# Patient Record
Sex: Male | Born: 1985 | Hispanic: Yes | Marital: Single | Smoking: Never smoker
Health system: Southern US, Community
[De-identification: ages and names within clinical notes are randomized; demographics above are authoritative.]

---

## 2014-02-15 ENCOUNTER — Ambulatory Visit: Payer: Self-pay | Admitting: Unknown Physician Specialty

## 2014-02-24 ENCOUNTER — Ambulatory Visit: Payer: Self-pay | Admitting: Unknown Physician Specialty

## 2014-03-09 ENCOUNTER — Ambulatory Visit: Payer: Self-pay | Admitting: Gastroenterology

## 2014-03-13 LAB — PATHOLOGY REPORT

## 2015-03-30 IMAGING — CT CT ABD-PELV W/ CM
2 of 4 series · 16 of 46 positions shown, 18 images · IV contrast (isovue)
Comparison: None.

CLINICAL DATA: Pain.

EXAM:
CT ABDOMEN AND PELVIS WITH CONTRAST
TECHNIQUE: Multidetector CT imaging of the abdomen and pelvis was performed
using the standard protocol following bolus administration of
intravenous contrast.
CONTRAST:  100 cc Isovue 370.

[Series 2: routine abd pel with · axial · 0.78mm/px · z∈[-884,-414]mm · 13 of 104 slices shown, 15 images]
[im 5/104  soft-tissue]
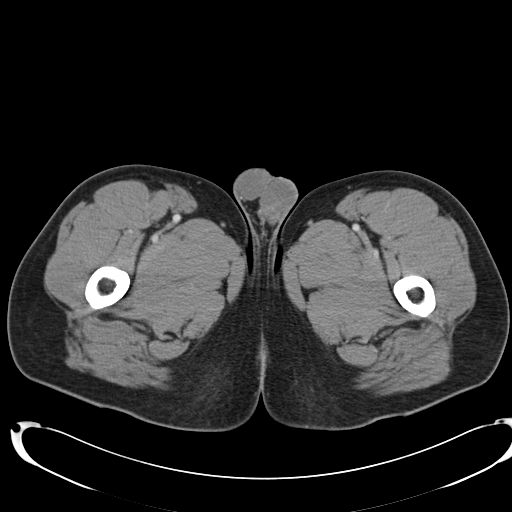
[im 5/104  bone]
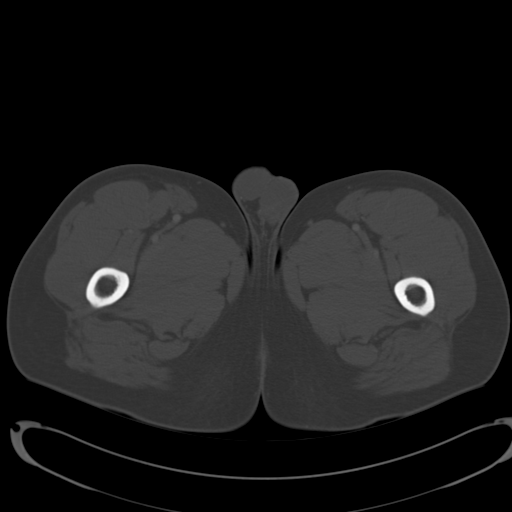
[im 13/104  soft-tissue]
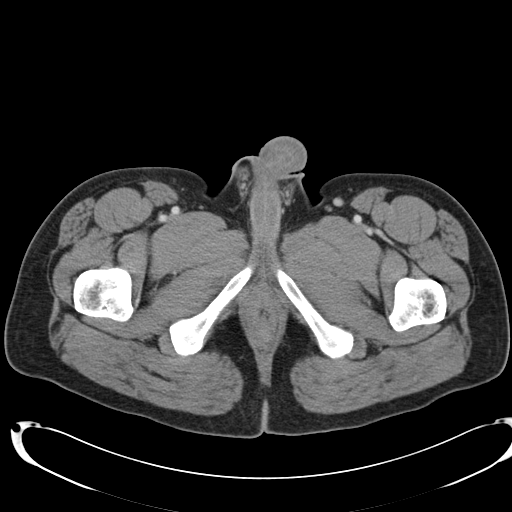
[im 21/104  soft-tissue]
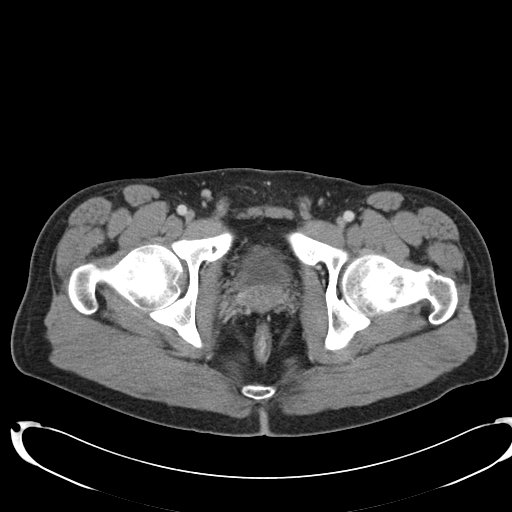
[im 29/104  soft-tissue]
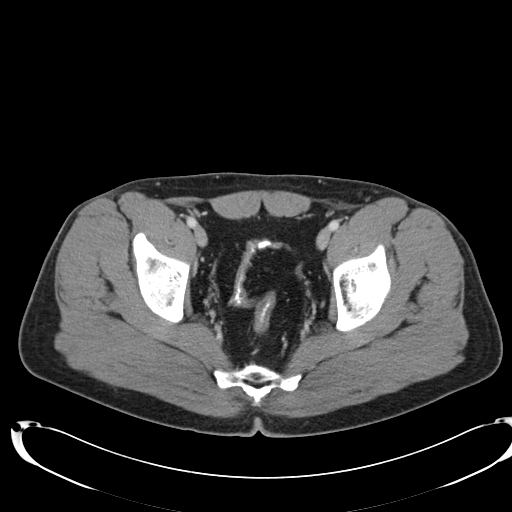
[im 38/104  soft-tissue]
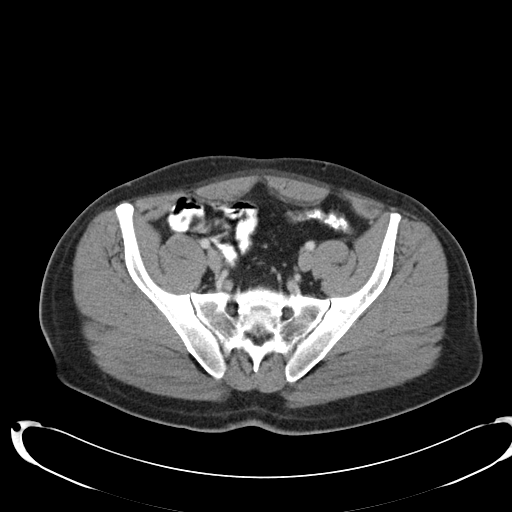
[im 46/104  soft-tissue]
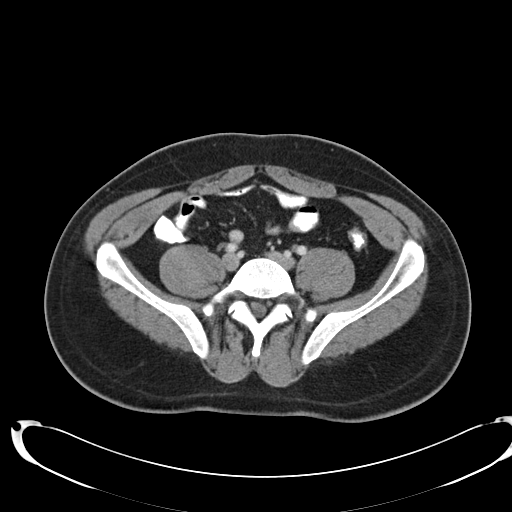
[im 54/104  soft-tissue]
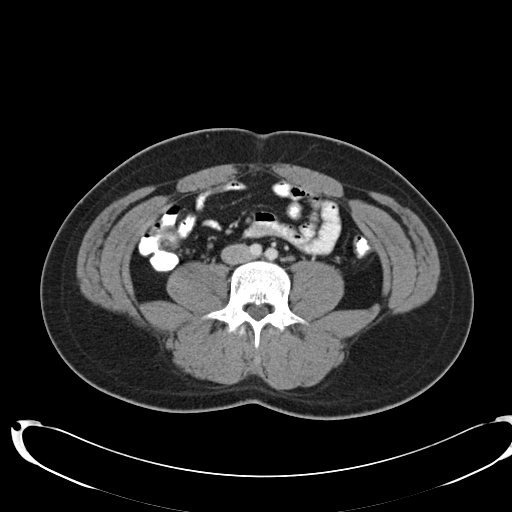
[im 58/104  soft-tissue]
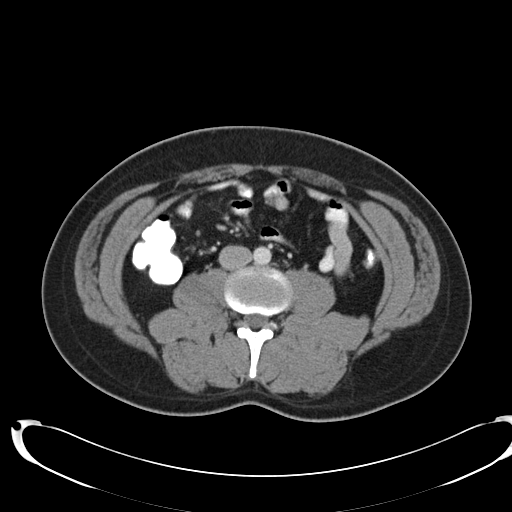
[im 66/104  soft-tissue]
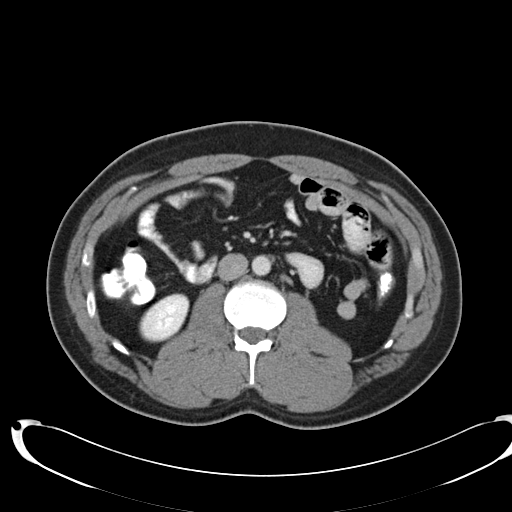
[im 66/104  bone]
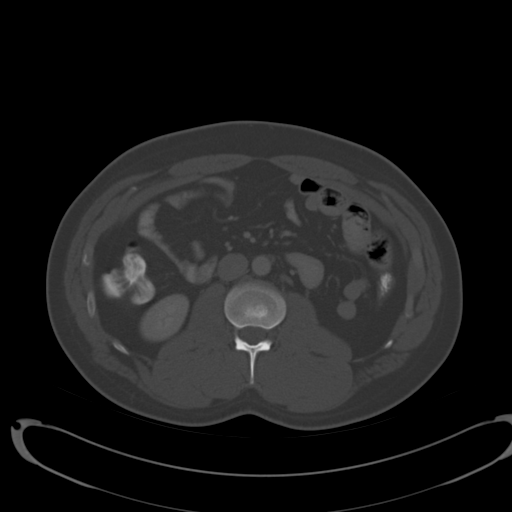
[im 75/104  soft-tissue]
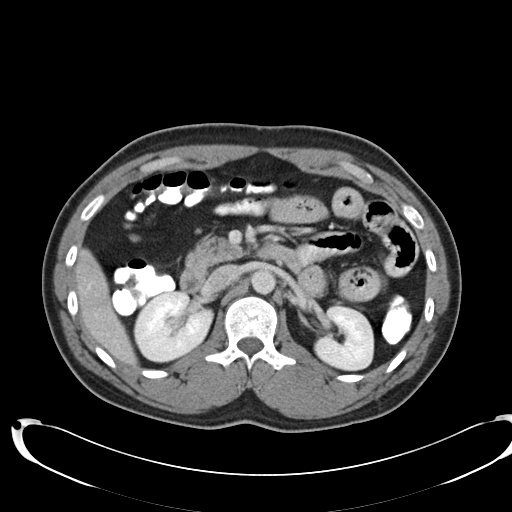
[im 83/104  soft-tissue]
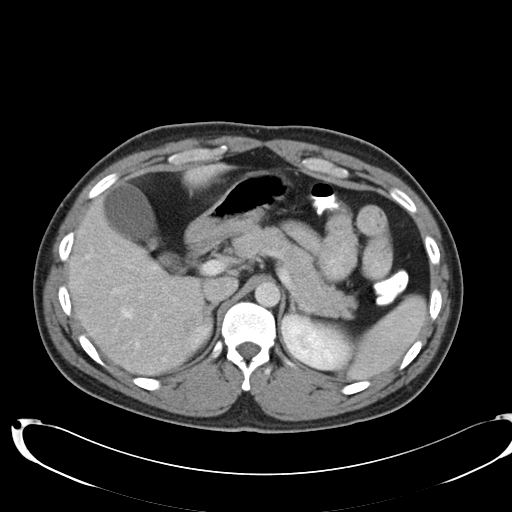
[im 91/104  soft-tissue]
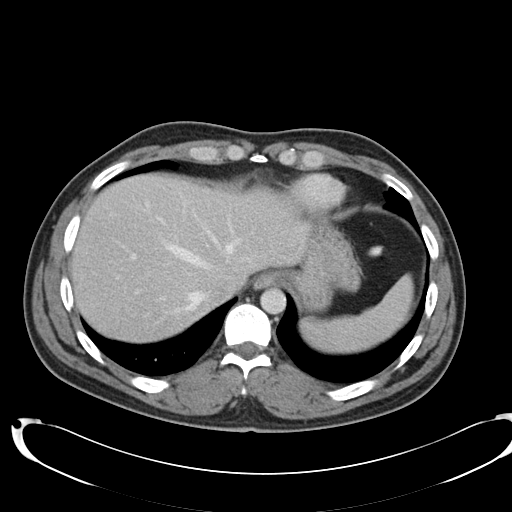
[im 99/104  soft-tissue]
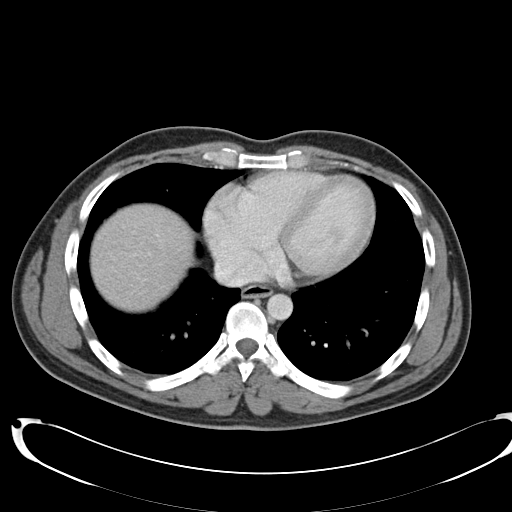

[Series 5: cor routine abd pel with · coronal · 0.69mm/px · 3 of 133 slices shown]
[im 45/133  soft-tissue]
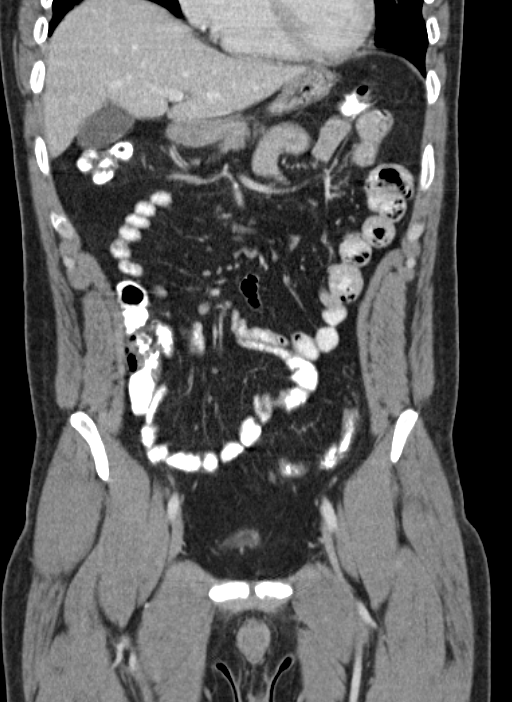
[im 59/133  soft-tissue]
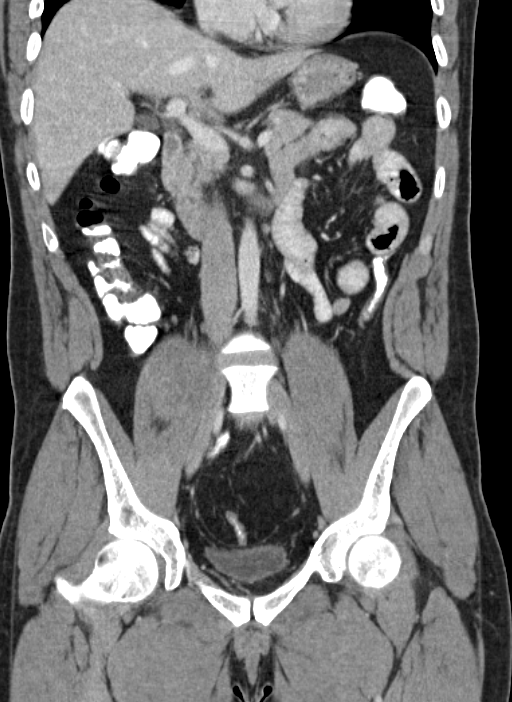
[im 74/133  soft-tissue]
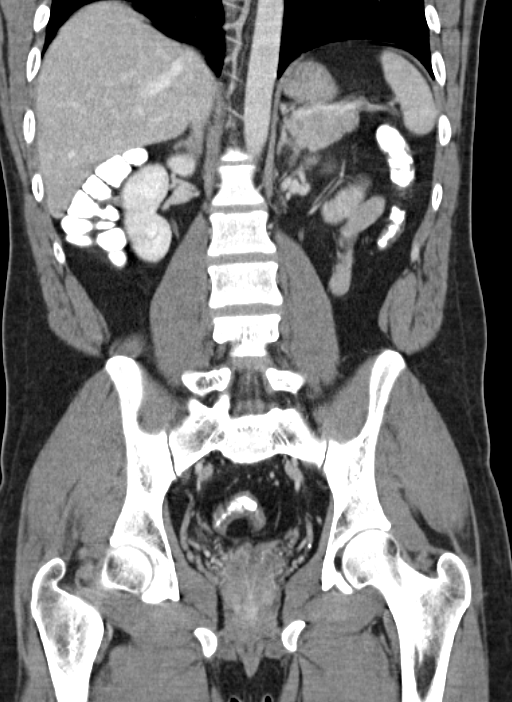

[16 of 46 positions shown; findings below may reference images not displayed]

FINDINGS: Fatty infiltration liver cannot be excluded. No focal hepatic
abnormality. Spleen normal. Pancreas normal. No biliary distention.
Gallbladder nondistended.

Adrenals normal. Kidneys normal. No hydronephrosis or obstructing
ureteral stone. Bladder is nondistended.

Shotty inguinal and retroperitoneal lymph nodes. Aorta normal
caliber widely patent. Visceral vessels widely patent. Portal vein
is patent.

Appendix normal. Mild thickening noted of the rectosigmoid and left
colon. This may be from the collapsed state of the bowel however
colitis cannot be excluded. There is no evidence of bowel
distention. There is no free air. No mesenteric mass. Tiny umbilical
hernia with herniation of fat only. No bowel herniation.

Lung bases clear. Heart size normal. No acute bony abnormality. Tiny
sclerotic densities are noted over the right ischium and right
acetabulum. Although these are most likely bone islands, blastic
metastatic disease cannot be excluded and whole body bone scan can
be obtained for further evaluation.
IMPRESSION: 1. Mild thickening noted diffusely of the rectosigmoid and left
colon. This may be from collapsed state of the bowel, however
colitis cannot be excluded.
2. Fatty infiltration of liver cannot be excluded.
3. Tiny sclerotic densities noted over the right ischium and right
acetabulum. Although these most likely represent bone islands,
blastic metastatic disease cannot be excluded and whole body bone
scan can be obtained for further evaluation.

## 2021-07-01 ENCOUNTER — Encounter: Payer: Self-pay | Admitting: Emergency Medicine

## 2021-07-01 ENCOUNTER — Ambulatory Visit
Admission: EM | Admit: 2021-07-01 | Discharge: 2021-07-01 | Disposition: A | Discharge status: Home / Self Care | Payer: Self-pay | Attending: Internal Medicine | Admitting: Internal Medicine

## 2021-07-01 ENCOUNTER — Other Ambulatory Visit: Payer: Self-pay

## 2021-07-01 DIAGNOSIS — J111 Influenza due to unidentified influenza virus with other respiratory manifestations: Secondary | ICD-10-CM

## 2021-07-01 MED ORDER — BENZONATATE 200 MG PO CAPS: 200.0000 mg | ORAL_CAPSULE | Freq: Three times a day (TID) | ORAL | 0 refills | Status: AC | PRN

## 2021-07-01 MED ORDER — OSELTAMIVIR PHOSPHATE 75 MG PO CAPS: 75.0000 mg | ORAL_CAPSULE | Freq: Two times a day (BID) | ORAL | 0 refills | Status: AC

## 2021-07-01 NOTE — ED Provider Notes (Signed)
[facility]    CSN: [id number] Arrival date & time: 07/01/21  0845      History   Chief Complaint Chief Complaint  Patient presents with   Cough    HPI Caleb Noble is a 35 y.o. male who presents with subjective fever, body aches, cough, nose congestion, rhinitis, ST and bilateral ear pain x 3 days. He was exposed to Flu in his home. He developed a subjective high  fever  and aches 2 nights ago. He felt worse yesterday. His daughter had Flu A.     History reviewed. No pertinent past medical history.  There are no problems to display for this patient.   History reviewed. No pertinent surgical history.     Home Medications    Prior to Admission medications   Medication Sig Start Date End Date Taking? Authorizing Provider  benzonatate (TESSALON) 200 MG capsule Take 1 capsule (200 mg total) by mouth 3 (three) times daily as needed. 07/01/21  Yes Rodriguez-Southworth, Nettie Elm, PA-C  oseltamivir (TAMIFLU) 75 MG capsule Take 1 capsule (75 mg total) by mouth every 12 (twelve) hours. 07/01/21  Yes Rodriguez-Southworth, Nettie Elm, PA-C    Family History No family history on file.  Social History Social History   Tobacco Use   Smoking status: Never   Smokeless tobacco: Never  Vaping Use   Vaping Use: Never used  Substance Use Topics   Alcohol use: Not Currently   Drug use: Not Currently     Allergies   Patient has no known allergies.   Review of Systems Review of Systems  Constitutional:  Positive for activity change, appetite change, chills, fatigue and fever.  HENT:  Positive for congestion, ear pain, postnasal drip and rhinorrhea. Negative for ear discharge, sore throat and trouble swallowing.        His lower throat hurts to cough  Eyes:  Negative for discharge.  Respiratory:  Positive for cough. Negative for chest tightness.   Cardiovascular:  Negative for chest pain.  Gastrointestinal:  Negative for diarrhea, nausea and vomiting.   Musculoskeletal:  Positive for myalgias. Negative for gait problem, neck pain and neck stiffness.  Skin:  Negative for rash.  Neurological:  Positive for headaches.  Hematological:  Positive for adenopathy.    Physical Exam Triage Vital Signs ED Triage Vitals  Enc Vitals Group     BP 07/01/21 0916 (!) 141/92     Pulse Rate 07/01/21 0916 (!) 105     Resp 07/01/21 0916 18     Temp 07/01/21 0916 99.3 F (37.4 C)     Temp Source 07/01/21 0916 Oral     SpO2 07/01/21 0916 100 %     Weight 07/01/21 0913 180 lb (81.6 kg)     Height 07/01/21 0913 5\' 7"  (1.702 m)     Head Circumference --      Peak Flow --      Pain Score 07/01/21 0913 8     Pain Loc --      Pain Edu? --      Excl. in GC? --    No data found.  Updated Vital Signs BP (!) 141/92 (BP Location: Left Arm)   Pulse (!) 105   Temp 99.3 F (37.4 C) (Oral)   Resp 18   Ht 5\' 7"  (1.702 m)   Wt 180 lb (81.6 kg)   SpO2 100%   BMI 28.19 kg/m   Visual Acuity Right Eye Distance:   Left Eye Distance:   Bilateral Distance:  Right Eye Near:   Left Eye Near:    Bilateral Near:     Physical Exam Constitutional:      General: He is not in acute distress.    Appearance: He is normal weight. He is ill-appearing. He is not toxic-appearing.  HENT:     Right Ear: Tympanic membrane, ear canal and external ear normal.     Left Ear: Tympanic membrane, ear canal and external ear normal.     Nose: Rhinorrhea present.     Mouth/Throat:     Mouth: Mucous membranes are moist.     Pharynx: Oropharynx is clear. No oropharyngeal exudate or posterior oropharyngeal erythema.  Eyes:     General: No scleral icterus.    Conjunctiva/sclera: Conjunctivae normal.  Neck:     Comments: Has mild R upper cervical chain node enlargement compared to the L, is soft and mobile  Cardiovascular:     Rate and Rhythm: Normal rate and regular rhythm.     Heart sounds: No murmur heard. Pulmonary:     Effort: Pulmonary effort is normal.     Breath  sounds: Normal breath sounds.  Musculoskeletal:        General: Normal range of motion.     Cervical back: Neck supple.  Skin:    General: Skin is warm and dry.     Findings: No rash.  Neurological:     Mental Status: He is alert and oriented to person, place, and time.     Gait: Gait normal.  Psychiatric:        Mood and Affect: Mood normal.        Behavior: Behavior normal.        Thought Content: Thought content normal.        Judgment: Judgment normal.     UC Treatments / Results  Labs (all labs ordered are listed, but only abnormal results are displayed) Labs Reviewed - No data to display  EKG   Radiology No results found.  Procedures Procedures (including critical care time)  Medications Ordered in UC Medications - No data to display  Initial Impression / Assessment and Plan / UC Course  I have reviewed the triage vital signs and the nursing notes. Influenza exposure and I suspect pt has it. Is at the end of the 48 h window when onset of fever started, so I went a head and placed him on Tamiflu and Tessalon. He declined work note.  Final Clinical Impressions(s) / UC Diagnoses   Final diagnoses:  Influenza-like illness   Discharge Instructions   None    ED Prescriptions     Medication Sig Dispense Auth. Provider   oseltamivir (TAMIFLU) 75 MG capsule Take 1 capsule (75 mg total) by mouth every 12 (twelve) hours. 10 capsule Rodriguez-Southworth, Nettie Elm, PA-C   benzonatate (TESSALON) 200 MG capsule Take 1 capsule (200 mg total) by mouth 3 (three) times daily as needed. 30 capsule Rodriguez-Southworth, Nettie Elm, PA-C      PDMP not reviewed this encounter.   Garey Ham, [place] 07/01/21 [phone]

## 2021-07-01 NOTE — ED Triage Notes (Addendum)
Pt c/o subjective fever, body aches, cough, nasal congestion, runny nose, bilateral ear pain, and sore throat. Started about 3 days ago. Pt has been exposed to flu in the house hold. Declines covid testing.
